# Patient Record
Sex: Male | Born: 1935 | Race: White | Hispanic: No | Marital: Married
Health system: Southern US, Community
[De-identification: ages and names within clinical notes are randomized; demographics above are authoritative.]

---

## 2007-03-26 ENCOUNTER — Ambulatory Visit: Payer: Self-pay

## 2008-11-20 ENCOUNTER — Inpatient Hospital Stay: Payer: Self-pay | Admitting: Internal Medicine

## 2011-09-27 ENCOUNTER — Inpatient Hospital Stay: Payer: Self-pay | Admitting: Internal Medicine

## 2011-09-27 LAB — BASIC METABOLIC PANEL
Anion Gap: 6 — ABNORMAL LOW (ref 7–16)
BUN: 11 mg/dL (ref 7–18)
Calcium, Total: 9 mg/dL (ref 8.5–10.1)
Chloride: 104 mmol/L (ref 98–107)
Co2: 28 mmol/L (ref 21–32)
EGFR (African American): >60
EGFR (Non-African Amer.): >60
Osmolality: 276 (ref 275–301)
Sodium: 138 mmol/L (ref 136–145)

## 2011-09-27 LAB — CBC
HCT: 41.5 % (ref 40.0–52.0)
HGB: 13.5 g/dL (ref 13.0–18.0)
MCH: 29.7 pg (ref 26.0–34.0)
MCHC: 32.5 g/dL (ref 32.0–36.0)
RBC: 4.55 10*6/uL (ref 4.40–5.90)
RDW: 16.3 % — ABNORMAL HIGH (ref 11.5–14.5)

## 2011-09-27 LAB — LIPID PANEL
Cholesterol: 145 mg/dL (ref 0–200)
HDL Cholesterol: 25 mg/dL — ABNORMAL LOW (ref 40–60)
Triglycerides: 131 mg/dL (ref 0–200)
VLDL Cholesterol, Calc: 26 mg/dL (ref 5–40)

## 2011-09-27 LAB — PRO B NATRIURETIC PEPTIDE: B-Type Natriuretic Peptide: 119 pg/mL (ref 0–450)

## 2011-09-27 LAB — TROPONIN I: Troponin-I: <0.02 ng/mL

## 2011-09-27 LAB — CK TOTAL AND CKMB (NOT AT ARMC): CK-MB: <0.5 ng/mL — ABNORMAL LOW (ref 0.5–3.6)

## 2011-09-28 LAB — BASIC METABOLIC PANEL
Osmolality: 272 (ref 275–301)
Potassium: 4.1 mmol/L (ref 3.5–5.1)
Sodium: 135 mmol/L — ABNORMAL LOW (ref 136–145)

## 2011-09-28 LAB — CK TOTAL AND CKMB (NOT AT ARMC)
CK, Total: 26 U/L — ABNORMAL LOW (ref 35–232)
CK-MB: 0.5 ng/mL (ref 0.5–3.6)

## 2011-09-29 LAB — BASIC METABOLIC PANEL
Chloride: 103 mmol/L (ref 98–107)
Co2: 24 mmol/L (ref 21–32)
Creatinine: 1.05 mg/dL (ref 0.60–1.30)
EGFR (Non-African Amer.): >60
Osmolality: 276 (ref 275–301)
Potassium: 4.2 mmol/L (ref 3.5–5.1)

## 2011-11-03 ENCOUNTER — Ambulatory Visit: Payer: Self-pay | Admitting: Internal Medicine

## 2012-02-24 ENCOUNTER — Emergency Department: Payer: Self-pay | Admitting: Emergency Medicine

## 2012-02-24 LAB — CBC
MCHC: 32.8 g/dL (ref 32.0–36.0)
MCV: 91 fL (ref 80–100)
RBC: 4.87 10*6/uL (ref 4.40–5.90)
RDW: 17.5 % — ABNORMAL HIGH (ref 11.5–14.5)

## 2012-02-24 LAB — COMPREHENSIVE METABOLIC PANEL
Albumin: 3.4 g/dL (ref 3.4–5.0)
Alkaline Phosphatase: 77 U/L (ref 50–136)
Anion Gap: 9 (ref 7–16)
BUN: 24 mg/dL — ABNORMAL HIGH (ref 7–18)
Calcium, Total: 9.4 mg/dL (ref 8.5–10.1)
Chloride: 97 mmol/L — ABNORMAL LOW (ref 98–107)
Co2: 25 mmol/L (ref 21–32)
Creatinine: 1.15 mg/dL (ref 0.60–1.30)
EGFR (African American): >60
Glucose: 129 mg/dL — ABNORMAL HIGH (ref 65–99)
Osmolality: 268 (ref 275–301)
Potassium: 4.5 mmol/L (ref 3.5–5.1)
SGOT(AST): 26 U/L (ref 15–37)
Sodium: 131 mmol/L — ABNORMAL LOW (ref 136–145)

## 2012-03-30 ENCOUNTER — Emergency Department: Payer: Self-pay | Admitting: Emergency Medicine

## 2012-03-30 LAB — CBC WITH DIFFERENTIAL/PLATELET
Basophil %: 0.2 %
Eosinophil #: 0.1 10*3/uL (ref 0.0–0.7)
Eosinophil %: 1.1 %
Lymphocyte #: 1.7 10*3/uL (ref 1.0–3.6)
MCH: 30.6 pg (ref 26.0–34.0)
MCHC: 32.7 g/dL (ref 32.0–36.0)
MCV: 94 fL (ref 80–100)
Monocyte #: 1 x10 3/mm (ref 0.2–1.0)
Neutrophil %: 74.8 %
Platelet: 298 10*3/uL (ref 150–440)
RBC: 4.22 10*6/uL — ABNORMAL LOW (ref 4.40–5.90)
WBC: 11.2 10*3/uL — ABNORMAL HIGH (ref 3.8–10.6)

## 2012-03-30 LAB — PROTIME-INR
INR: 0.8
Prothrombin Time: 11.3 secs — ABNORMAL LOW (ref 11.5–14.7)

## 2012-03-31 ENCOUNTER — Observation Stay: Payer: Self-pay | Admitting: Family Medicine

## 2012-03-31 LAB — CBC
HGB: 13 g/dL (ref 13.0–18.0)
MCH: 31 pg (ref 26.0–34.0)
MCHC: 33.3 g/dL (ref 32.0–36.0)
MCV: 93 fL (ref 80–100)
Platelet: 289 10*3/uL (ref 150–440)
RBC: 4.18 10*6/uL — ABNORMAL LOW (ref 4.40–5.90)
RDW: 20.5 % — ABNORMAL HIGH (ref 11.5–14.5)

## 2012-03-31 LAB — TROPONIN I: Troponin-I: 0.03 ng/mL

## 2012-03-31 LAB — CK TOTAL AND CKMB (NOT AT ARMC)
CK, Total: 19 U/L — ABNORMAL LOW (ref 35–232)
CK-MB: 1.5 ng/mL (ref 0.5–3.6)

## 2012-03-31 LAB — COMPREHENSIVE METABOLIC PANEL
Albumin: 3.1 g/dL — ABNORMAL LOW (ref 3.4–5.0)
Alkaline Phosphatase: 70 U/L (ref 50–136)
Anion Gap: 6 — ABNORMAL LOW (ref 7–16)
BUN: 21 mg/dL — ABNORMAL HIGH (ref 7–18)
Bilirubin,Total: 1 mg/dL (ref 0.2–1.0)
Chloride: 106 mmol/L (ref 98–107)
Glucose: 148 mg/dL — ABNORMAL HIGH (ref 65–99)
Potassium: 4.4 mmol/L (ref 3.5–5.1)
SGOT(AST): 21 U/L (ref 15–37)
SGPT (ALT): 32 U/L (ref 12–78)
Total Protein: 6.7 g/dL (ref 6.4–8.2)

## 2012-03-31 LAB — PRO B NATRIURETIC PEPTIDE: B-Type Natriuretic Peptide: 439 pg/mL (ref 0–450)

## 2012-04-05 ENCOUNTER — Inpatient Hospital Stay: Payer: Self-pay | Admitting: Family Medicine

## 2012-04-05 LAB — COMPREHENSIVE METABOLIC PANEL
Albumin: 2.6 g/dL — ABNORMAL LOW (ref 3.4–5.0)
Alkaline Phosphatase: 91 U/L (ref 50–136)
Anion Gap: 8 (ref 7–16)
BUN: 20 mg/dL — ABNORMAL HIGH (ref 7–18)
Bilirubin,Total: 0.4 mg/dL (ref 0.2–1.0)
Chloride: 100 mmol/L (ref 98–107)
Creatinine: 0.84 mg/dL (ref 0.60–1.30)
Glucose: 161 mg/dL — ABNORMAL HIGH (ref 65–99)
Potassium: 4.7 mmol/L (ref 3.5–5.1)
SGOT(AST): 20 U/L (ref 15–37)
SGPT (ALT): 29 U/L (ref 12–78)
Total Protein: 6.6 g/dL (ref 6.4–8.2)

## 2012-04-05 LAB — TROPONIN I: Troponin-I: 0.43 ng/mL — ABNORMAL HIGH

## 2012-04-05 LAB — PRO B NATRIURETIC PEPTIDE: B-Type Natriuretic Peptide: 1819 pg/mL — ABNORMAL HIGH (ref 0–450)

## 2012-04-05 LAB — CBC
MCH: 30.8 pg (ref 26.0–34.0)
Platelet: 284 10*3/uL (ref 150–440)
RBC: 3.41 10*6/uL — ABNORMAL LOW (ref 4.40–5.90)
RDW: 19.5 % — ABNORMAL HIGH (ref 11.5–14.5)
WBC: 14.2 10*3/uL — ABNORMAL HIGH (ref 3.8–10.6)

## 2012-04-06 LAB — TROPONIN I
Troponin-I: 0.79 ng/mL — ABNORMAL HIGH
Troponin-I: 0.6 ng/mL — ABNORMAL HIGH

## 2012-04-06 LAB — BASIC METABOLIC PANEL
Anion Gap: 9 (ref 7–16)
Calcium, Total: 9.3 mg/dL (ref 8.5–10.1)
Chloride: 97 mmol/L — ABNORMAL LOW (ref 98–107)
Co2: 30 mmol/L (ref 21–32)
EGFR (Non-African Amer.): >60
Glucose: 152 mg/dL — ABNORMAL HIGH (ref 65–99)
Osmolality: 278 (ref 275–301)
Potassium: 4 mmol/L (ref 3.5–5.1)

## 2012-04-06 LAB — CULTURE, BLOOD (SINGLE)

## 2012-04-07 LAB — CBC WITH DIFFERENTIAL/PLATELET
Basophil #: 0 10*3/uL (ref 0.0–0.1)
Eosinophil #: 0.1 10*3/uL (ref 0.0–0.7)
HCT: 28.6 % — ABNORMAL LOW (ref 40.0–52.0)
Lymphocyte %: 6.6 %
MCH: 31.4 pg (ref 26.0–34.0)
MCHC: 33.9 g/dL (ref 32.0–36.0)
Monocyte #: 0.6 x10 3/mm (ref 0.2–1.0)
Monocyte %: 4 %
Neutrophil #: 12.3 10*3/uL — ABNORMAL HIGH (ref 1.4–6.5)
Neutrophil %: 88.1 %
Platelet: 330 10*3/uL (ref 150–440)
RDW: 18.9 % — ABNORMAL HIGH (ref 11.5–14.5)
WBC: 13.9 10*3/uL — ABNORMAL HIGH (ref 3.8–10.6)

## 2012-04-07 LAB — BASIC METABOLIC PANEL
Anion Gap: 7 (ref 7–16)
BUN: 35 mg/dL — ABNORMAL HIGH (ref 7–18)
Co2: 32 mmol/L (ref 21–32)
EGFR (African American): >60
EGFR (Non-African Amer.): >60
Glucose: 146 mg/dL — ABNORMAL HIGH (ref 65–99)
Osmolality: 273 (ref 275–301)
Sodium: 131 mmol/L — ABNORMAL LOW (ref 136–145)

## 2012-04-08 LAB — CBC WITH DIFFERENTIAL/PLATELET
Basophil #: 0 10*3/uL (ref 0.0–0.1)
Basophil %: 0.1 %
Eosinophil #: 0 10*3/uL (ref 0.0–0.7)
HGB: 9.2 g/dL — ABNORMAL LOW (ref 13.0–18.0)
Lymphocyte %: 5.9 %
MCHC: 32.5 g/dL (ref 32.0–36.0)
MCV: 93 fL (ref 80–100)
Monocyte %: 2.1 %
Neutrophil %: 91.8 %
RBC: 3.06 10*6/uL — ABNORMAL LOW (ref 4.40–5.90)
RDW: 19 % — ABNORMAL HIGH (ref 11.5–14.5)
WBC: 9.4 10*3/uL (ref 3.8–10.6)

## 2012-04-08 LAB — BASIC METABOLIC PANEL
Anion Gap: 8 (ref 7–16)
BUN: 43 mg/dL — ABNORMAL HIGH (ref 7–18)
Calcium, Total: 9.1 mg/dL (ref 8.5–10.1)
Chloride: 91 mmol/L — ABNORMAL LOW (ref 98–107)
Co2: 32 mmol/L (ref 21–32)
Creatinine: 1.04 mg/dL (ref 0.60–1.30)
Osmolality: 279 (ref 275–301)
Potassium: 4.1 mmol/L (ref 3.5–5.1)

## 2012-04-09 LAB — BASIC METABOLIC PANEL
Calcium, Total: 9.3 mg/dL (ref 8.5–10.1)
Chloride: 90 mmol/L — ABNORMAL LOW (ref 98–107)
EGFR (African American): >60
EGFR (Non-African Amer.): 58 — ABNORMAL LOW
Glucose: 225 mg/dL — ABNORMAL HIGH (ref 65–99)
Osmolality: 281 (ref 275–301)
Potassium: 4.4 mmol/L (ref 3.5–5.1)
Sodium: 129 mmol/L — ABNORMAL LOW (ref 136–145)

## 2012-04-09 LAB — CBC WITH DIFFERENTIAL/PLATELET
Basophil #: 0 10*3/uL (ref 0.0–0.1)
Basophil %: 0.3 %
Eosinophil %: 0 %
HGB: 9.2 g/dL — ABNORMAL LOW (ref 13.0–18.0)
Lymphocyte %: 4.5 %
MCH: 30.8 pg (ref 26.0–34.0)
MCV: 93 fL (ref 80–100)
Monocyte #: 0.5 x10 3/mm (ref 0.2–1.0)
Neutrophil %: 91 %
Platelet: 389 10*3/uL (ref 150–440)
RBC: 2.99 10*6/uL — ABNORMAL LOW (ref 4.40–5.90)
WBC: 11.8 10*3/uL — ABNORMAL HIGH (ref 3.8–10.6)

## 2012-04-10 LAB — CBC WITH DIFFERENTIAL/PLATELET
Bands: 1 %
HCT: 22.5 % — ABNORMAL LOW (ref 40.0–52.0)
HGB: 7.6 g/dL — ABNORMAL LOW (ref 13.0–18.0)
MCHC: 33.8 g/dL (ref 32.0–36.0)
Metamyelocyte: 1 %
Monocytes: 3 %
Myelocyte: 2 %
Platelet: 362 10*3/uL (ref 150–440)
RBC: 2.43 10*6/uL — ABNORMAL LOW (ref 4.40–5.90)
Segmented Neutrophils: 85 %
WBC: 12.5 10*3/uL — ABNORMAL HIGH (ref 3.8–10.6)

## 2012-04-10 LAB — BASIC METABOLIC PANEL
BUN: 60 mg/dL — ABNORMAL HIGH (ref 7–18)
Chloride: 93 mmol/L — ABNORMAL LOW (ref 98–107)
Co2: 29 mmol/L (ref 21–32)
Creatinine: 1.09 mg/dL (ref 0.60–1.30)
EGFR (African American): >60
EGFR (Non-African Amer.): >60
Osmolality: 285 (ref 275–301)
Potassium: 4.8 mmol/L (ref 3.5–5.1)
Sodium: 130 mmol/L — ABNORMAL LOW (ref 136–145)

## 2012-04-10 LAB — SEDIMENTATION RATE: Erythrocyte Sed Rate: 81 mm/hr — ABNORMAL HIGH (ref 0–20)

## 2012-04-11 ENCOUNTER — Ambulatory Visit: Payer: Self-pay | Admitting: Internal Medicine

## 2012-04-11 LAB — CBC WITH DIFFERENTIAL/PLATELET
Lymphocytes: 7 %
MCHC: 32.3 g/dL (ref 32.0–36.0)
MCV: 93 fL (ref 80–100)
Myelocyte: 4 %
NRBC/100 WBC: 1 /
RBC: 2.5 10*6/uL — ABNORMAL LOW (ref 4.40–5.90)
RDW: 19.2 % — ABNORMAL HIGH (ref 11.5–14.5)
WBC: 16.8 10*3/uL — ABNORMAL HIGH (ref 3.8–10.6)

## 2012-04-11 LAB — BASIC METABOLIC PANEL
Anion Gap: 7 (ref 7–16)
BUN: 49 mg/dL — ABNORMAL HIGH (ref 7–18)
Calcium, Total: 8.8 mg/dL (ref 8.5–10.1)
Chloride: 94 mmol/L — ABNORMAL LOW (ref 98–107)
EGFR (African American): >60
EGFR (Non-African Amer.): >60
Glucose: 171 mg/dL — ABNORMAL HIGH (ref 65–99)
Sodium: 130 mmol/L — ABNORMAL LOW (ref 136–145)

## 2012-04-11 LAB — HEPATIC FUNCTION PANEL A (ARMC)
Albumin: 2.5 g/dL — ABNORMAL LOW (ref 3.4–5.0)
Alkaline Phosphatase: 68 U/L (ref 50–136)
Bilirubin,Total: 0.3 mg/dL (ref 0.2–1.0)
SGOT(AST): 19 U/L (ref 15–37)
SGPT (ALT): 28 U/L (ref 12–78)
Total Protein: 6.1 g/dL — ABNORMAL LOW (ref 6.4–8.2)

## 2012-04-11 LAB — IRON AND TIBC
Iron Bind.Cap.(Total): 401 ug/dL (ref 250–450)
Iron Saturation: 19 %
Iron: 76 ug/dL (ref 65–175)

## 2012-04-11 LAB — RETICULOCYTES
Absolute Retic Count: 0.157 10*6/uL — ABNORMAL HIGH (ref 0.031–0.129)
Reticulocyte: 6.3 % — ABNORMAL HIGH (ref 0.7–2.5)

## 2012-04-11 LAB — LACTATE DEHYDROGENASE: LDH: 268 U/L — ABNORMAL HIGH (ref 85–241)

## 2012-04-11 LAB — FERRITIN: Ferritin (ARMC): 380 ng/mL (ref 8–388)

## 2012-04-12 LAB — CBC WITH DIFFERENTIAL/PLATELET
HCT: 28 % — ABNORMAL LOW (ref 40.0–52.0)
HGB: 9.2 g/dL — ABNORMAL LOW (ref 13.0–18.0)
MCH: 30.5 pg (ref 26.0–34.0)
MCV: 93 fL (ref 80–100)
Metamyelocyte: 6 %
Platelet: 391 10*3/uL (ref 150–440)
RBC: 3.03 10*6/uL — ABNORMAL LOW (ref 4.40–5.90)
Segmented Neutrophils: 74 %
Variant Lymphocyte - H1-Rlymph: 2 %
WBC: 25.8 10*3/uL — ABNORMAL HIGH (ref 3.8–10.6)

## 2012-04-12 LAB — BASIC METABOLIC PANEL
Anion Gap: 8 (ref 7–16)
BUN: 41 mg/dL — ABNORMAL HIGH (ref 7–18)
Co2: 26 mmol/L (ref 21–32)
Creatinine: 1.01 mg/dL (ref 0.60–1.30)
EGFR (African American): >60
EGFR (Non-African Amer.): >60
Glucose: 208 mg/dL — ABNORMAL HIGH (ref 65–99)
Osmolality: 275 (ref 275–301)
Potassium: 5.2 mmol/L — ABNORMAL HIGH (ref 3.5–5.1)
Sodium: 129 mmol/L — ABNORMAL LOW (ref 136–145)

## 2012-04-12 LAB — SODIUM, URINE, RANDOM: Sodium, Urine Random: 30 mmol/L (ref 20–110)

## 2012-04-12 LAB — CREATININE, URINE, RANDOM: Creatinine, Urine Random: 58.7 mg/dL (ref 30.0–125.0)

## 2012-04-13 LAB — BASIC METABOLIC PANEL
Calcium, Total: 8.8 mg/dL (ref 8.5–10.1)
Chloride: 96 mmol/L — ABNORMAL LOW (ref 98–107)
EGFR (African American): >60
Potassium: 4.9 mmol/L (ref 3.5–5.1)
Sodium: 132 mmol/L — ABNORMAL LOW (ref 136–145)

## 2012-04-13 LAB — CBC WITH DIFFERENTIAL/PLATELET
HCT: 27.1 % — ABNORMAL LOW (ref 40.0–52.0)
MCH: 30.2 pg (ref 26.0–34.0)
MCHC: 32.6 g/dL (ref 32.0–36.0)
MCV: 93 fL (ref 80–100)
Metamyelocyte: 1 %
Monocytes: 5 %
Myelocyte: 2 %
NRBC/100 WBC: 1 /
Platelet: 427 10*3/uL (ref 150–440)
RBC: 2.92 10*6/uL — ABNORMAL LOW (ref 4.40–5.90)
RDW: 18.6 % — ABNORMAL HIGH (ref 11.5–14.5)
Segmented Neutrophils: 80 %
Variant Lymphocyte - H1-Rlymph: 2 %

## 2012-04-13 LAB — UR PROT ELECTROPHORESIS, URINE RANDOM

## 2012-04-14 LAB — CBC WITH DIFFERENTIAL/PLATELET
Bands: 3 %
HCT: 27.8 % — ABNORMAL LOW (ref 40.0–52.0)
Lymphocytes: 7 %
MCH: 30.3 pg (ref 26.0–34.0)
MCHC: 32.2 g/dL (ref 32.0–36.0)
MCV: 94 fL (ref 80–100)
Metamyelocyte: 2 %
NRBC/100 WBC: 2 /
Platelet: 426 10*3/uL (ref 150–440)
RDW: 18.8 % — ABNORMAL HIGH (ref 11.5–14.5)
Segmented Neutrophils: 85 %
WBC: 20.9 10*3/uL — ABNORMAL HIGH (ref 3.8–10.6)

## 2012-04-14 LAB — BASIC METABOLIC PANEL
Anion Gap: 6 — ABNORMAL LOW (ref 7–16)
BUN: 34 mg/dL — ABNORMAL HIGH (ref 7–18)
Chloride: 98 mmol/L (ref 98–107)
Co2: 29 mmol/L (ref 21–32)
Glucose: 207 mg/dL — ABNORMAL HIGH (ref 65–99)
Osmolality: 280 (ref 275–301)
Potassium: 4.7 mmol/L (ref 3.5–5.1)
Sodium: 133 mmol/L — ABNORMAL LOW (ref 136–145)

## 2012-04-16 LAB — PROTEIN ELECTROPHORESIS(ARMC)

## 2012-05-12 ENCOUNTER — Ambulatory Visit: Payer: Self-pay | Admitting: Internal Medicine

## 2012-05-12 DEATH — deceased

## 2013-05-12 IMAGING — CR DG CHEST 2V
1 series · 2 of 2 positions shown · non-contrast
Comparison: none

REASON FOR EXAM: short of breath, tachycardia
COMMENTS:

PROCEDURE:     DXR - DXR CHEST PA (OR AP) AND LATERAL  - March 31, 2012  [DATE]
RESULT:     Comparison: 09/27/2011

[Series 1: w chest pa · 0.14mm/px · 2 of 2 slices shown]
[im 1/2]
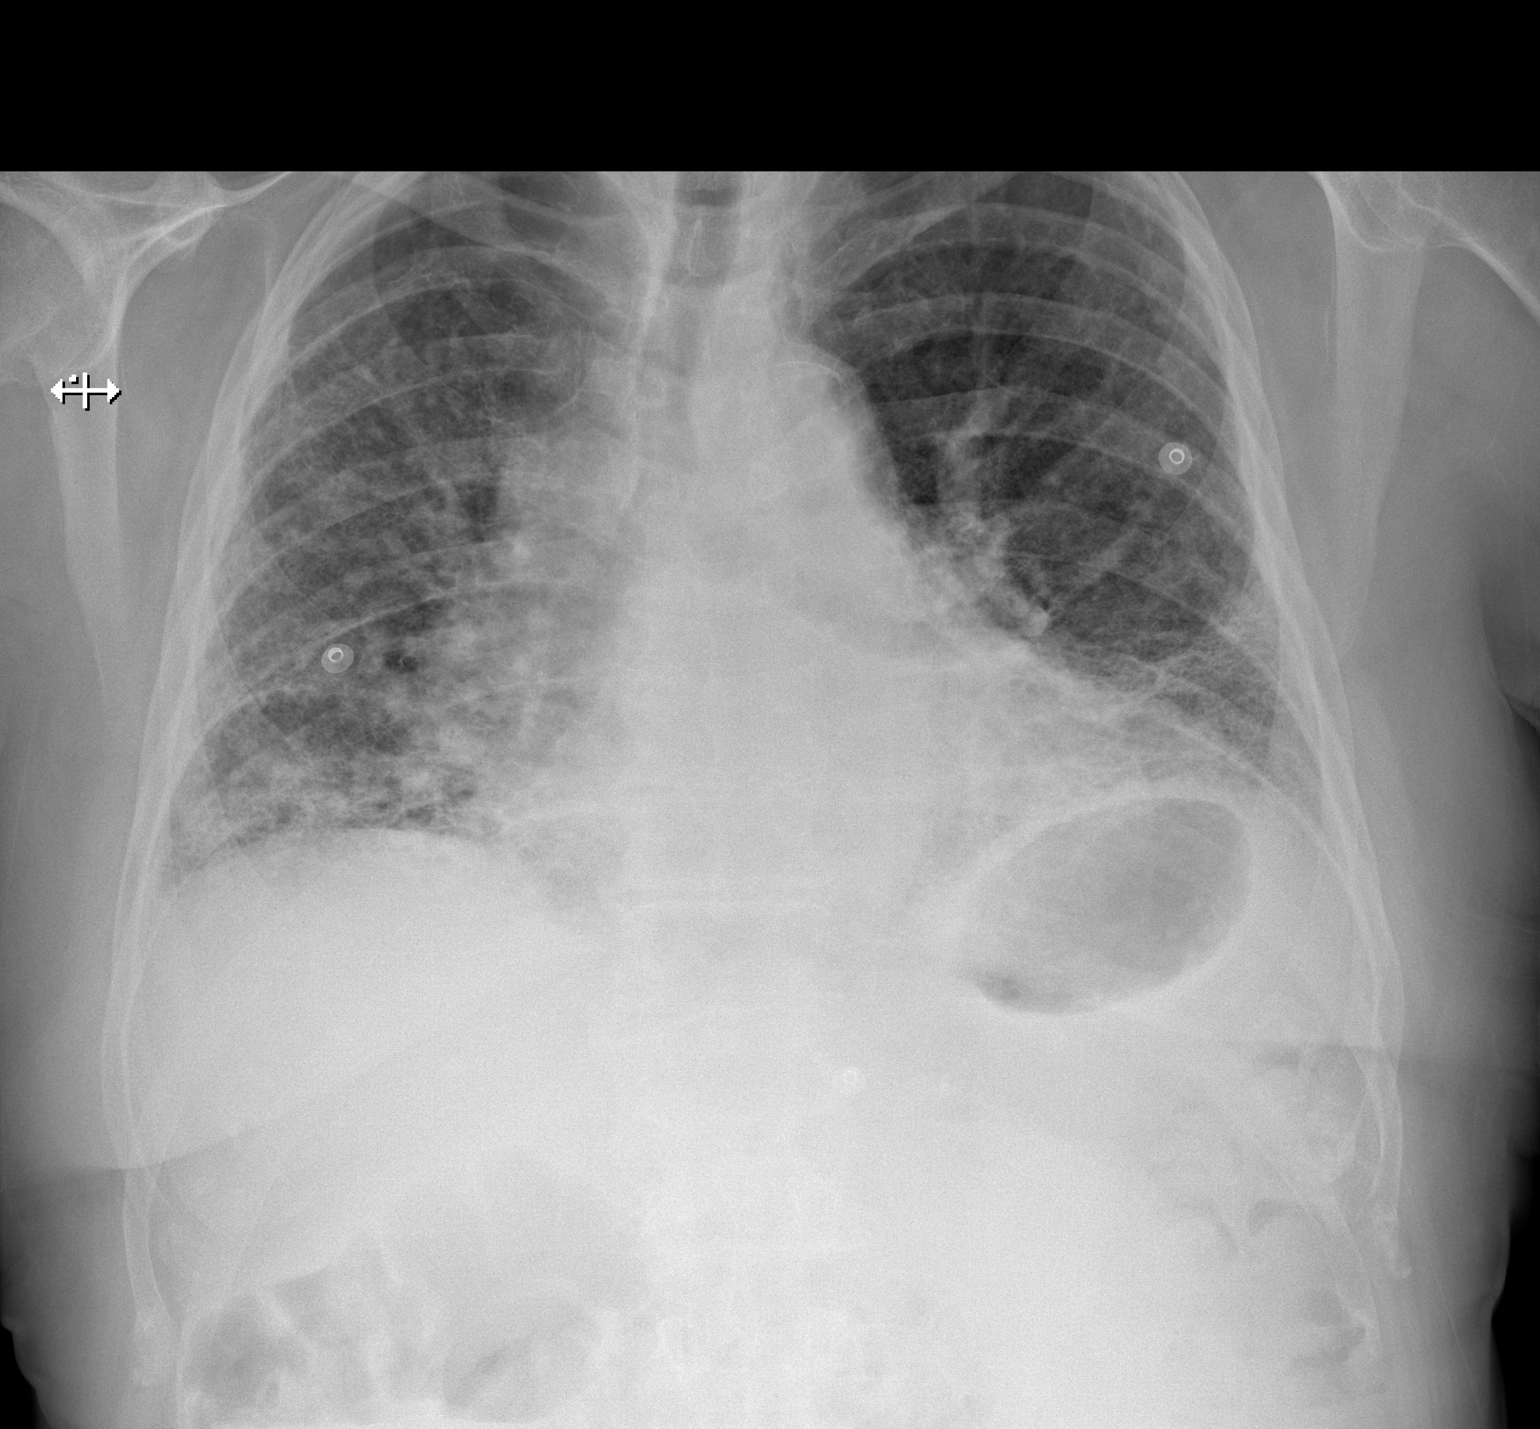
[im 2/2]
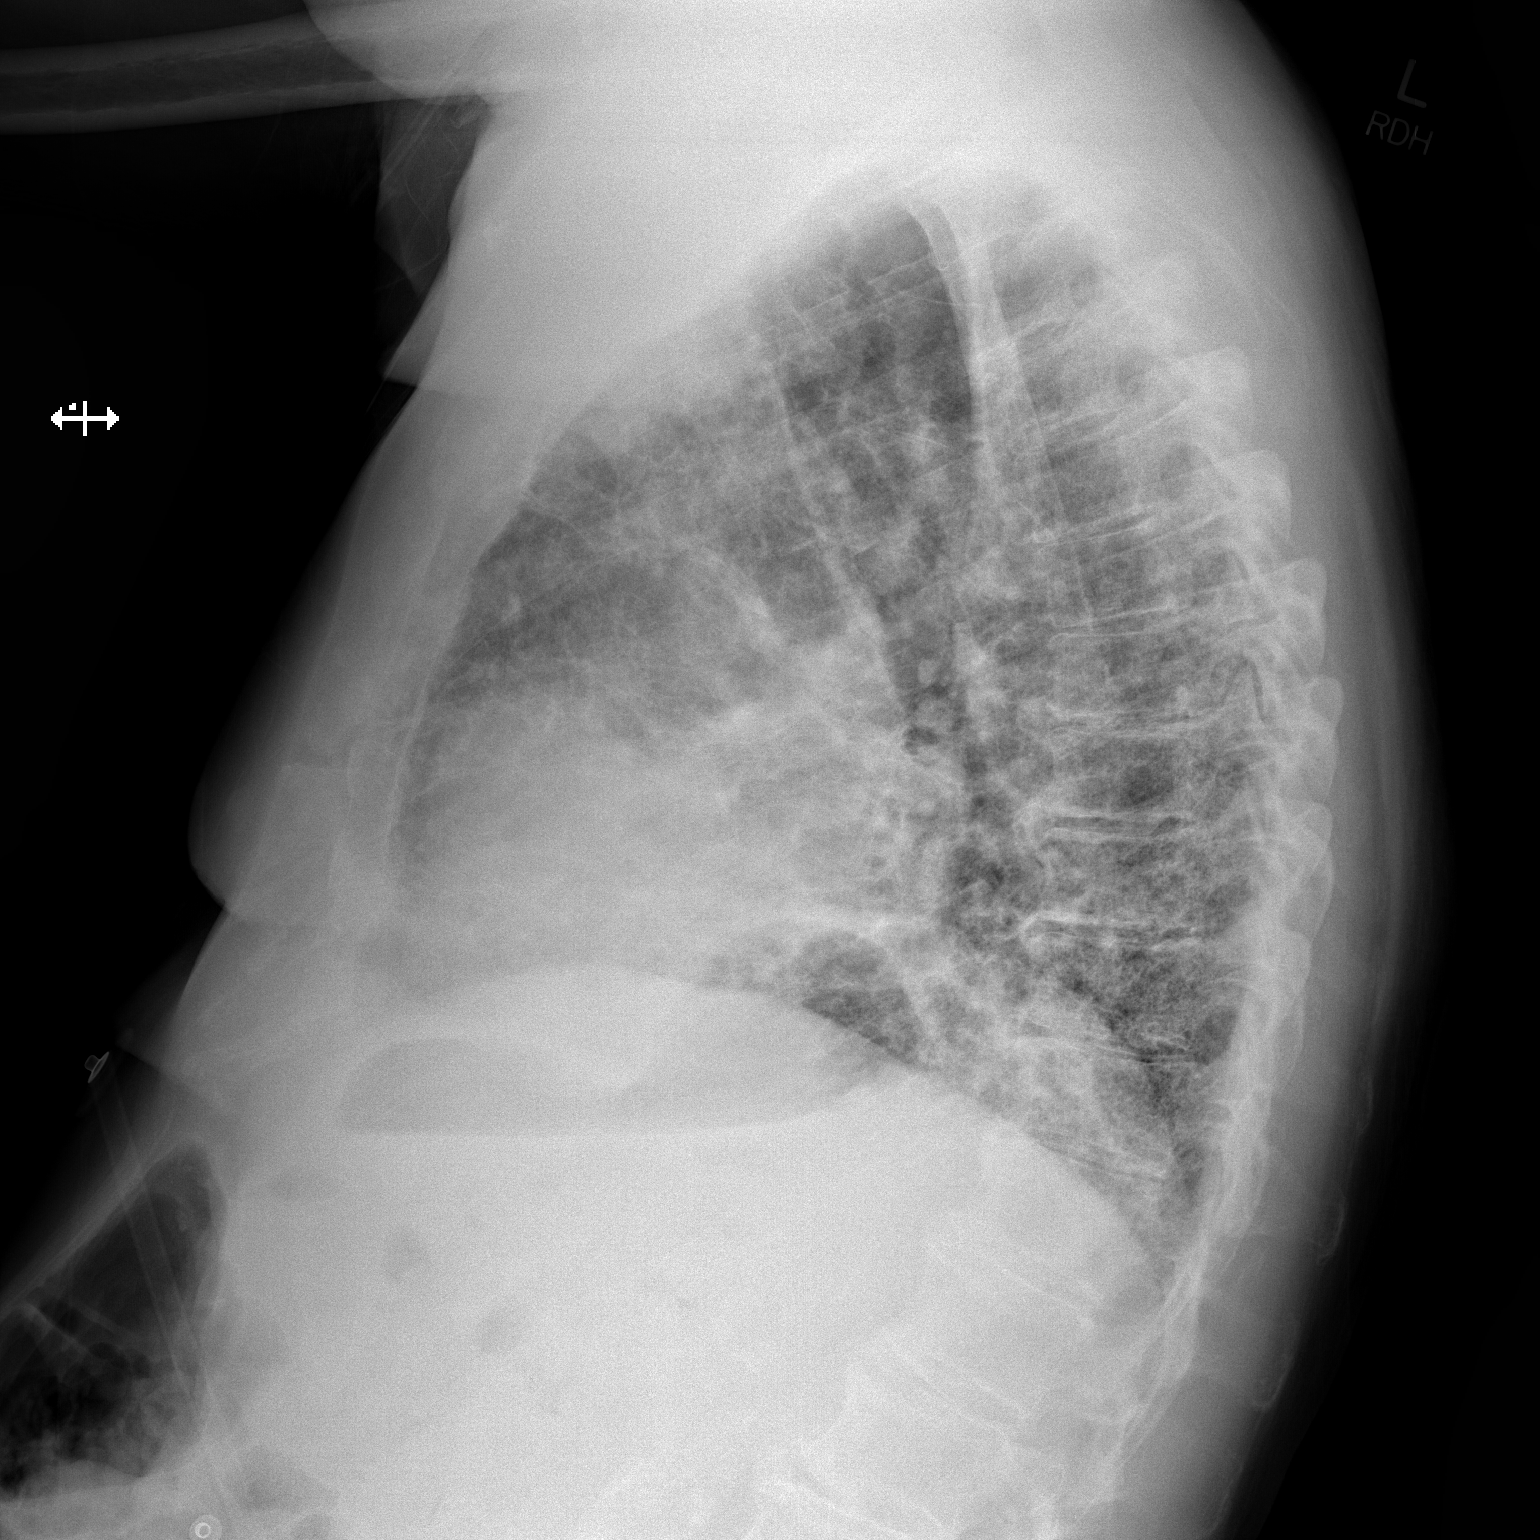

[2 of 2 positions shown; findings below may reference images not displayed]

FINDINGS: PA and lateral chest radiographs are provided. There is bilateral diffuse
interstitial thickening likely representing chronic interstitial disease,
but superimposed mild interstitial edema versus interstitial pneumonitis
secondary to an infectious or inflammatory etiology cannot be excluded.
There is no focal parenchymal opacity, pleural effusion, or pneumothorax.
The heart and mediastinum are unremarkable.  The osseous structures are
unremarkable.
IMPRESSION: Bilateral diffuse interstitial thickening likely representing chronic
interstitial disease, but superimposed mild interstitial edema versus
interstitial pneumonitis secondary to an infectious or inflammatory etiology
cannot be excluded.

[REDACTED]

## 2013-05-20 IMAGING — CT CT CHEST W/ CM
2 series · 15 of 31 positions shown, 19 images · IV contrast (APPLIED)
Comparison: none

REASON FOR EXAM: hypoxia
COMMENTS:

PROCEDURE:     CT  - CT CHEST (FOR PE) W  - April 08, 2012 [DATE]
RESULT:     Chest CT dated 04/08/2012 comparison made to prior study dated
09/27/2011.
TECHNIQUE: Helical 3 mm sections are obtained the thoracic inlet through the
lung bases status post intravenous administration of 100 mm of [id number].

[Series 4: soft tissue · axial · 0.74mm/px · z∈[+125,+173]mm · 2 of 103 slices shown]
[im 8/103  mediastinal]
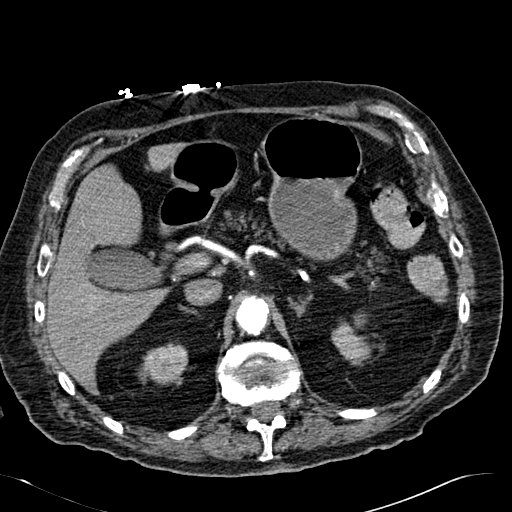
[im 24/103  mediastinal]
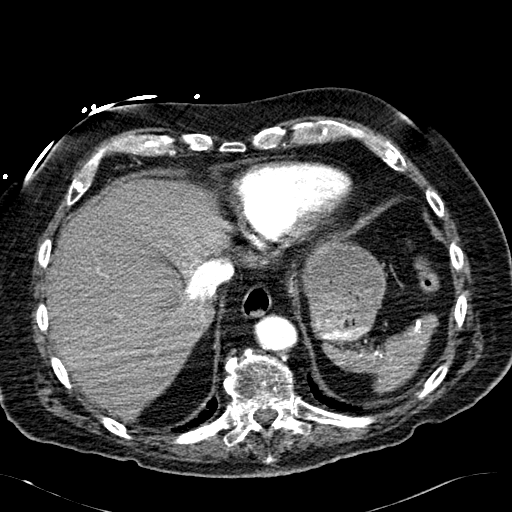

[Series 5: lung windows · axial · 0.74mm/px · z∈[+131,+386]mm · 13 of 101 slices shown, 17 images]
[im 8/101  mediastinal]
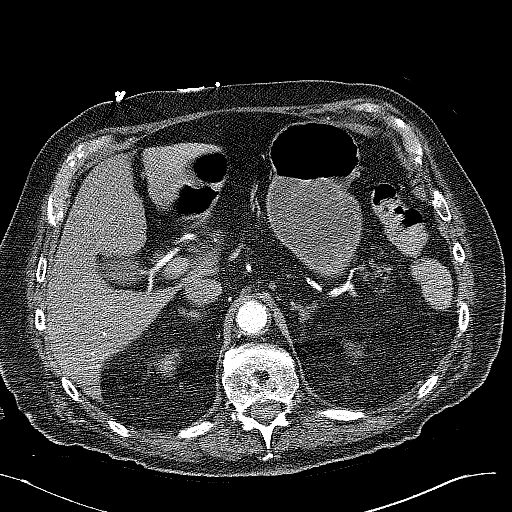
[im 8/101  lung]
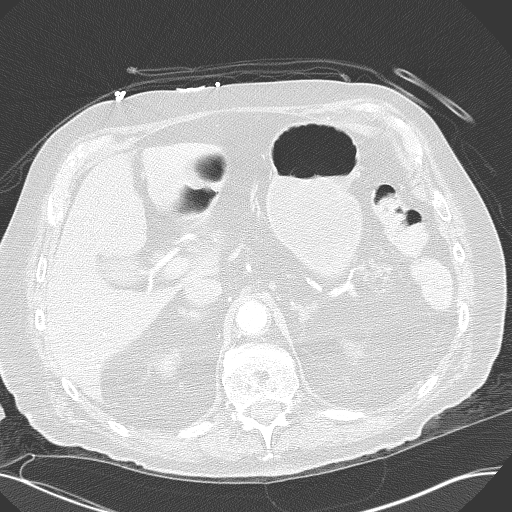
[im 16/101  lung]
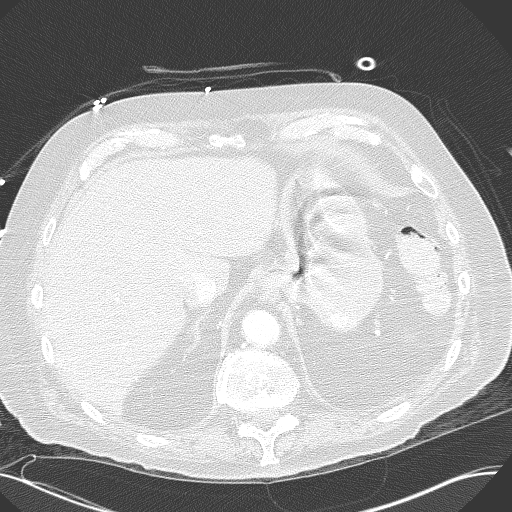
[im 24/101  lung]
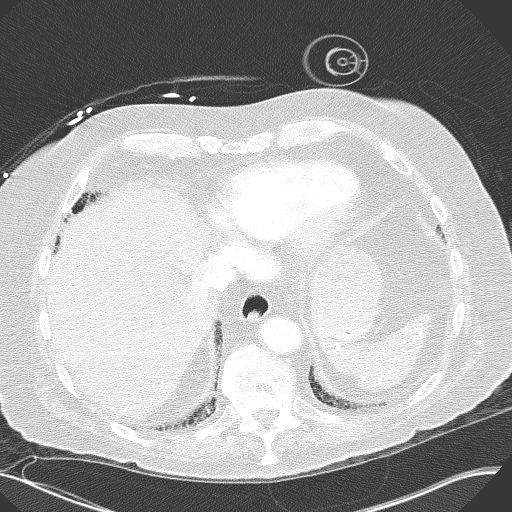
[im 31/101  lung]
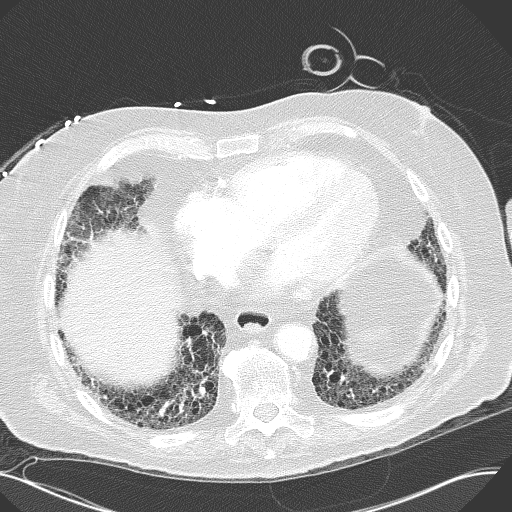
[im 39/101  mediastinal]
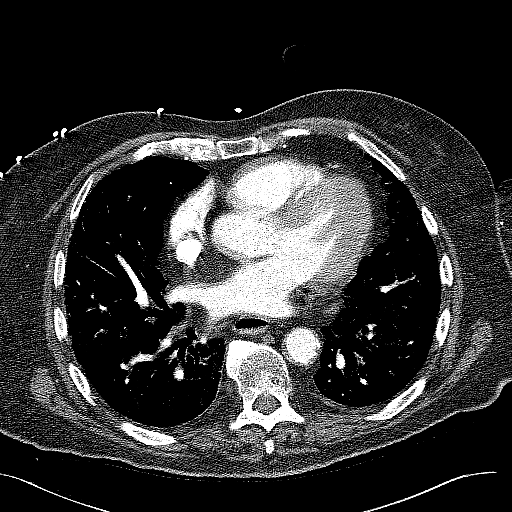
[im 39/101  lung]
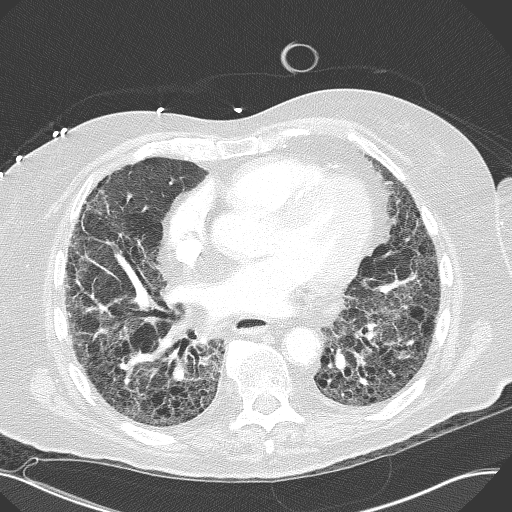
[im 47/101  lung]
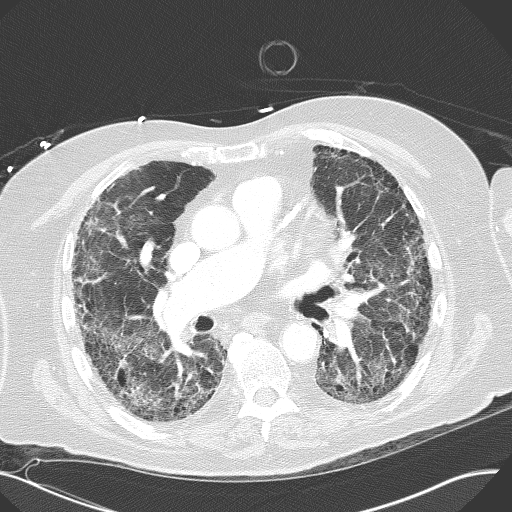
[im 51/101  lung]
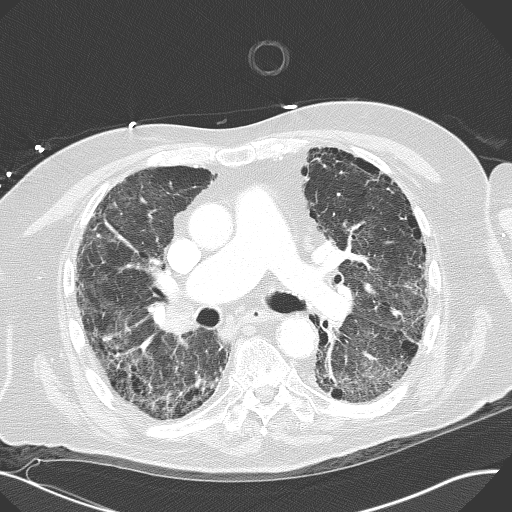
[im 54/101  lung]
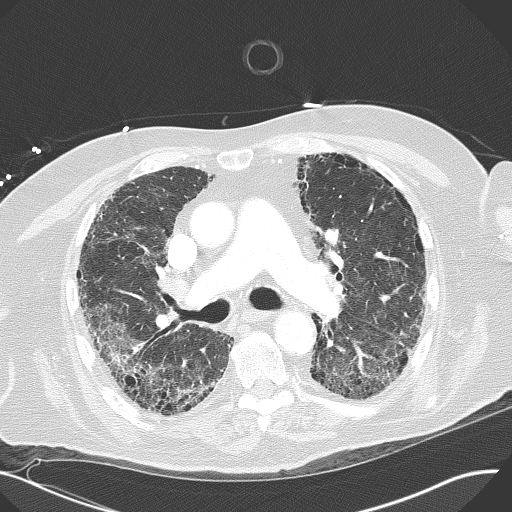
[im 62/101  mediastinal]
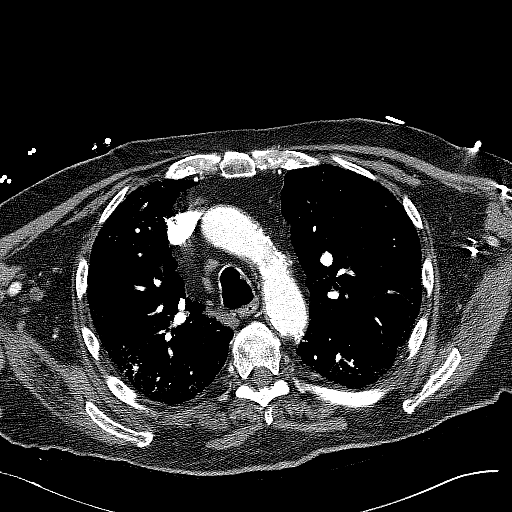
[im 62/101  lung]
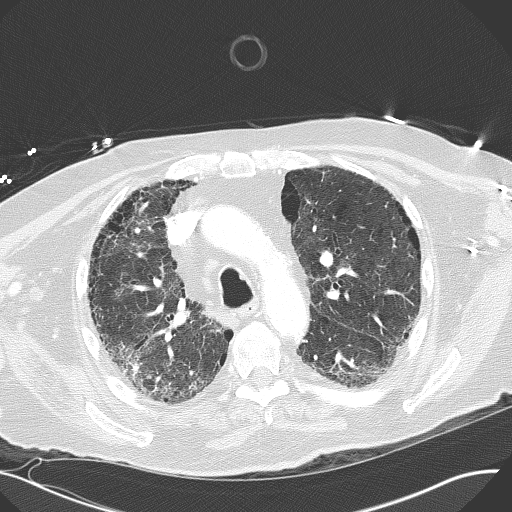
[im 70/101  lung]
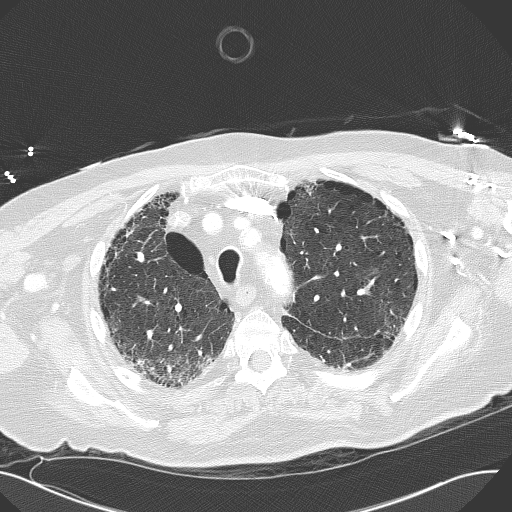
[im 77/101  lung]
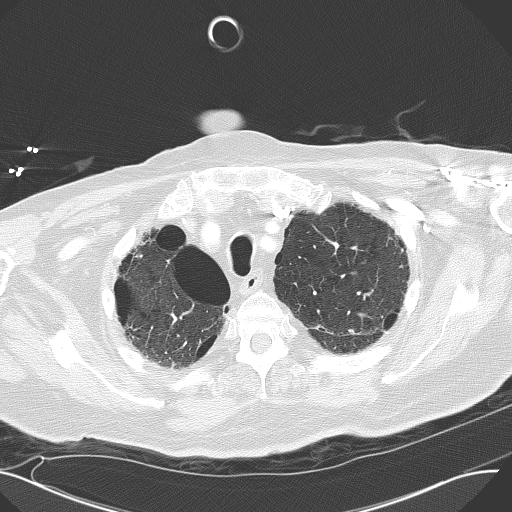
[im 85/101  lung]
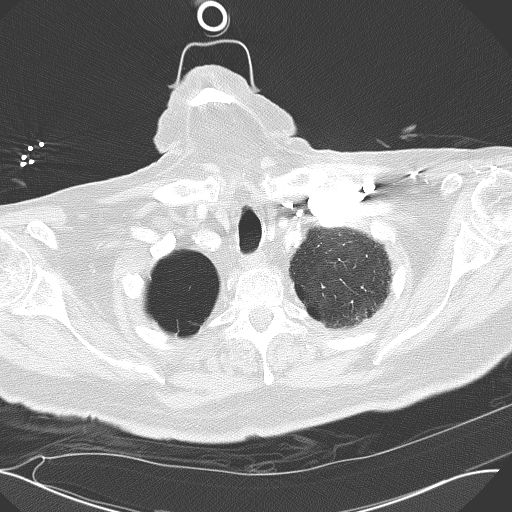
[im 93/101  mediastinal]
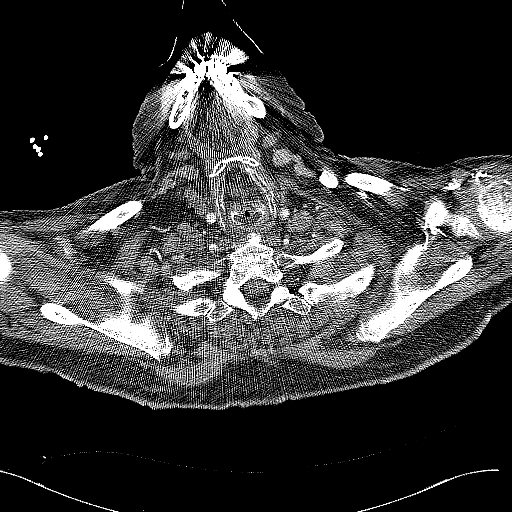
[im 93/101  lung]
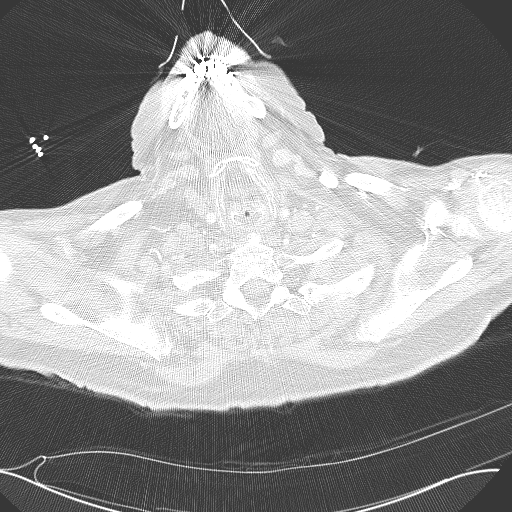

[15 of 31 positions shown; findings below may reference images not displayed]

FINDINGS: The mediastinum and hilar regions and structures demonstrate
multichamber cardiac enlargement. Prominent, slightly, lymph nodes
identified within the prevascular space, peritracheal region, right and left
hilar regions, and the subcarinal region.

There is no evidence of filling defects within the main, lobar, or segmental
pulmonary arteries.

The lung parenchyma demonstrates severe if symptoms changes within the lung
apices on the left. Areas of peripheral bullous disease identified within
the right left hemithoraces. There is diffuse groundglass opacities
throughout both lungs as well as prominence of interstitial markings, bi-
basilar honeycombing, areas of bronchiectasis, intralobular and subpleural
septal thickening.

Visualized upper abdominal viscera demonstrates multiple calcified densities
within the liver and spleen as well as likely a cyst within the left kidney.
IMPRESSION: No CT evidence of pulmonary embolic disease.
2. End-stage interstitial fibrotic changes as well as emphysematous changes.
3. Likely reactive lymph nodes in the mediastinum
4. Findings consistent with prior granulomatous disease.

## 2014-07-29 NOTE — H&P (Signed)
PATIENT NAME:  Suszanne FinchQUEEN, Sheehan R MR#:  [id number] DATE OF BIRTH:  April 12, 1928  DATE OF ADMISSION:  03/31/2012  PRIMARY CARE PHYSICIAN: Dr. Burnadette PopLinthavong.   CHIEF COMPLAINT: Shortness of breath.   HISTORY OF PRESENT ILLNESS: This is an 81 year old male patient with a history of COPD, oxygen independent, and a history diastolic heart failure, coronary artery disease, came in because the patient was feeling shortness of breath. The patient had epistaxis and nose was packed yesterday. The patient is supposed to use oxygen 4 L, but was unable to use it because nose was packed, so he came in here, and patient found to have heart rate of up to 136, so I was asked to admit the patient for sinus tachycardia and COPD exacerbation. The patient says he has trouble breathing on exertion, but this is not new, and denies any cough and no recent change in his breathing symptoms. The patient has a history of diastolic heart failure, history of COPD. The patient has been getting Lyrica and Neurontin for postherpetic neuralgia. He had shingles 6 days ago and finished Valtrex and right now, he is on Lyrica and Neurontin that Dr. Burnadette PopLinthavong prescribed. His nose pack was removed in the Emergency Room from the left nose. The patient right now is not bleeding from the nose, and he is on aspirin and Plavix for his stent. He did not take any Plavix today. The patient was given Augmentin yesterday after the nose was packed.   ALLERGIES: He has no allergies.   MEDICATIONS: Augmentin 500/125 mg p.o. b.i.d., aspirin 81 mg daily, Plavix 75 mg daily, fish oil 1.2 g daily, furosemide 20 mg as needed, gabapentin 300 mg at bedtime, Imdur 30 mg p.o. daily, lovastatin 20 mg daily, Lyrica 75 mg p.o. b.i.d., metoprolol succinate ER 25 mg p.o. daily, Osteo Bi-Flex 1 tablet daily, Symbicort 80/4.5 two puffs b.i.d., tamsulosin 0.4 mg daily.   FAMILY HISTORY: Father died of  MI.  SOCIAL HISTORY: He lives with a son. Ex-smoker, smoked for 60 years.    REVIEW OF SYSTEMS:   CONSTITUTIONAL: He feels fatigue and pain over the right chest wall, pain due to postherpetic neuralgia. EYES: No blurred vision.  ENT: No tinnitus. No ear pain. No epistaxis. He had epistaxis yesterday and nose was packed, and I can see dried blood in the left nostril.  RESPIRATORY: Has no wheezing. Has history of COPD, oxygen dependent.  CARDIOVASCULAR: Feels somewhat tightness in the chest on and off, which is not new according to him.  GASTROINTESTINAL: No nausea, no vomiting.  GENITOURINARY: No dysuria.  ENDOCRINE: No polyuria or nocturia.  INTEGUMENTARY: No skin rashes.  MUSCULOSKELETAL: Has no joint pains.  NEUROLOGIC: No weakness. The patient has no tremors.  PSYCHIATRIC: No anxiety or insomnia.   PHYSICAL EXAMINATION:  VITAL SIGNS: Temperature 97, pulse 136 initially. During my visit, it was around 115. Respirations 18 to 20. Blood pressure 104/51. Sats 92%. He is right now on 4 L, saturating around 95%.  GENERAL: He is alert, awake, oriented.  HEENT: Head atraumatic, normocephalic. Pupils equally reacting to light. Extraocular movements are intact. ENT: No tympanic membrane congestion. No turbinate hypertrophy. No oropharyngeal erythema.  NECK: Normal range of motion. No JVD. No carotid bruit.  CARDIOVASCULAR: S1, S2, regular, tachycardic.  LUNGS: Decreased breath sounds overall, but no wheezing, no rales.  ABDOMEN: Soft, obese. Bowel sounds present. I do not see any shingles rash on the right side of abdominal wall.  EXTREMITIES: The patient has 1+ edema. No cyanosis.  No clubbing.  NEUROLOGIC: Alert, awake, oriented. Cranial nerves II through XII intact. Power 5/5 in upper and lower extremities. Sensation is intact. DTRs 2+ bilaterally.   LABORATORY DATA: Chest x-ray shows interstitial thickening, representing chronic interstitial disease, but superimposed interstitial edema or interstitial pneumonia secondary to infectious or inflammatory etiology cannot  be excluded. WBC 11.1, hemoglobin 13, hematocrit 39  Electrolytes: Sodium 139, potassium 4.4, chloride 106, bicarbonate 27, BUN 21, creatinine 0.90, glucose 148. BNP is 439. EKG shows sinus tachycardia with 119 beats per minute with no ST-T changes.   ASSESSMENT AND PLAN:  The patient is an 81 year old male patient with shortness of breath, is chronic. His shortness of breath is likely secondary to not able to use the oxygen at night due to history of epistaxis yesterday. Right now, he is saturating around 98% on 4 L, and he has severe lung disease via PET scan during the last visit. The patient can use his medications, which are his nebulizers as needed.   The patient has elevated heart rate, sinus tachycardia, probably secondary to pain, but he has some postherpetic neuralgia. He did receive Dilaudid in the Emergency Room. He got Dilaudid 0.5 IV, two doses. The patient was started on Lyrica and Neurontin by his doctor, Dr. Burnadette Pop,  so he can continue that and get p.r.n. Dilaudid if needed. Also, we are going to increase Toprol-XL to 50 mg daily.   Coronary artery disease with recent stent placement. The patient is on aspirin and Plavix, but Plavix was stopped yesterday and today due to epistaxis, but his epistaxis has stopped. We are going to start him in the morning and watch him. If he restarts to bleed, we can call Dr. Gwen Pounds.   Continue to finish the Augmentin.   Condition at this time is stable. The patient actually looks like superimposed edema versus pneumonia due to infectious or inflammatory etiologies cannot be excluded daily, but I really do not think he has any acute process going on at this time and we will watch him overnight on telemetry. Discussed the plan with patient's son.   TIME SPENT: About 60 minutes on this patient.   ____________________________ Katha Hamming, MD sk:jm D: 03/31/2012 18:13:26 ET T: 03/31/2012 20:26:06 ET  JOB#: [id number]  cc: Katha Hamming, MD, <Dictator> Katha Hamming MD ELECTRONICALLY SIGNED 04/01/2012 19:40

## 2014-07-29 NOTE — H&P (Signed)
PATIENT NAME:  Jeffery Mitchell, Jeffery Mitchell MR#:  [id number] DATE OF BIRTH:  [birth year]  DATE OF ADMISSION:  04/05/2012  PRIMARY CARE PHYSICIAN: Dr. Burnadette Pop.   REFERRING PHYSICIAN: Dr. Jene Every.   CHIEF COMPLAINT: Progressive increase in shortness of breath for 3 to 4 days.   HISTORY OF PRESENT ILLNESS: The patient is an 81 year old pleasant Caucasian male with history of coronary artery disease status post stent implant, history of diastolic congestive heart failure, chronic obstructive pulmonary disease, home oxygen dependent. The patient was just discharged from the hospital on April 01, 2012, at that time admitted with shortness of breath secondary to chronic obstructive pulmonary disease exacerbation. The patient tells me that over the last 3 to 4 days he noticed progressive increase in shortness of breath, started as exertional dyspnea, then this worsened until the last 24 hours, he has dyspnea at rest. He also noted some peripheral edema in his ankles in the last several days. He reports little cough with minimal sputum production. No fever. Regarding chest pain, he says that he has intermittent chest pain at the midsternal area, lasts about 10 minutes, usually with exertion then will subside. This is ongoing for the last three months. Today, while the patient at home, his shortness of breath worsened.  EMS was called; and upon their arrival, his oxygen saturation was low,  reaching 66%. He was placed on nonrebreather with oxygen supplementation and his oxygen saturation increased to 80%, and transferred to the emergency department. Evaluation here revealed mild elevation of troponin reaching 0.43. He also has elevated B-type natriuretic peptide and pulmonary vascular congestion with pulmonary interstitial edema. Cannot rule out underlying pneumonia or infiltrates. The patient was admitted for further evaluation and treatment.   PAST MEDICAL HISTORY:  1.  Recent admission on March 31, 2012, and  discharged on April 01, 2012, with shortness of breath attributed to exacerbation of chronic obstructive pulmonary disease.  2.  Coronary artery disease, status post stent implant: The last cardiac catheter was in July of this year, 2013, that showed proximal LAD 85% stenosis. There was also first diagonal stenosis 70%, also the proximal circumflex was showing 30% stenosis. Proximal right coronary artery was 60% stenosis. Mid right coronary artery was 30%. A second lesion was 40% stenosis. Right posterior descending artery was showing 40% stenosis. The patient underwent stent implant.  3.  His history also includes systemic hypertension.  4.  Diastolic congestive heart failure.  5.  Hyperlipidemia.   PAST SURGICAL HISTORY: Coronary angiography and stent implant; hernia repair; and cataract surgery.   Social habits: Ex-chronic smoker.  He has a history of 60 years of smoking. Patient quit smoking in 2007. Additionally, he worked in hosiery.  No history of alcoholism or other drug abuse.   SOCIAL HISTORY: The patient is married and living with his wife. 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   FAMILY HISTORY: His father died from a heart attack, unclear at what age.   Admission medications:  1.  Lyrica 75 mg twice a day.  2.  Tramadol 50 mg every six hours p.r.n. 3.  Augmentin 500 mg twice a day.  4.  Aspirin 81 mg a day.  5.  Plavix 75 mg a day.  6.  Furosemide 20 mg as needed.  7.  Fish oil once a day.  8.  Isosorbide mononitrate 30 mg once a day.  9.  Lovastatin 20 mg once a day.  10.  Metoprolol succinate extended-release 25 mg once a day.  11.  Multivitamin once a day.  12.  Osteo-biFlex 1 tablet once a day.  13.  Symbicort 80/4.5 mcg 2 puffs twice a day as needed.  14.  Tamsulosin 0.4 mg once a day.   ALLERGIES: He is allergic to peanuts. This is only food allergy.  No medication allergy.   PHYSICAL EXAMINATION:  VITAL SIGNS: Blood pressure 124/59, respiratory rate 24,  pulse 101, temperature 97.3. His oxygen saturation earlier was 66% then later was 80% and now it is 100%. He is on oxygen nonrebreather.  GENERAL APPEARANCE: Elderly male laying in bed in mild to moderate respiratory distress.  HEAD AND NECK: No pallor. No icterus. No cyanosis.  EARS, NOSE, AND THROAT: Ear examination revealed normal hearing. No lesions, no discharge. Nasal mucosa was normal without ulcers, no discharge. No bleeding. Oropharyngeal area showed no ulcers, no oral thrush. No exudates.  EYES: Normal iris and conjunctivae. Pupils are about 5 mm, round, equal and reactive to light.  NECK: Supple. Trachea at midline. No thyromegaly. No cervical lymphadenopathy. No masses.  HEART: Distant heart sounds. Faint S1 and S2. No S3 or S4. No murmur. No gallop. No carotid bruits.  RESPIRATORY: Slight tachypnea without using accessory muscles. He has slight prolongation of expiratory phase. A few scattered rhonchi and rales on both lungs. Decreased breathing sounds. Increased AP diameter of the chest.  ABDOMEN: Obese, soft without tenderness. No hepatosplenomegaly. No masses. No hernias.  SKIN: No ulcers. No subcutaneous nodules. He had 2 to +3 bipedal edema.  MUSCULOSKELETAL: No joint swelling. No clubbing.  NEUROLOGIC: Cranial nerves II through XII are intact. No focal motor deficit. Sense of touch is intact.  PSYCHIATRIC: The patient is alert and oriented x3. Mood and affect were normal.   Laboratory, diagnostic, and radiological data: Chest x-ray showed cardiomegaly and increased pulmonary interstitial markings either secondary to interstitial edema or infiltrates.   EKG showed normal sinus rhythm at rate of 98 per minute. Unremarkable EKG.   Serum glucose 161. B-type natriuretic peptide was 1819.  Of note, a week ago, his BNP was only 439. BUN 20, creatinine 0.8.  Sodium was slightly low at 135, potassium 4.7. Normal liver function tests except for slightly low albumin at 2.6 with normal liver  transaminases. Troponin is slightly elevated at 0.43. CBC showed white count of 14,000, hemoglobin 10.5, hematocrit 32 with platelet count of 284. His hemoglobin a week ago was 13. Of note, the patient a week ago, had epistaxis.   ASSESSMENT:  1.  Acute respiratory failure.  2.  Pulmonary interstitial edema, likely the interpretation of the chest x-ray finding on his presentation, but cannot rule out entirely if he has pulmonary infiltrates or pneumonia.  3.  Intermittent chest pain: This is pretty much stable over the last three months, however, there is elevated troponin either secondary to non-ST elevation myocardial infarction or likely is secondary to increased demand secondary to the acute respiratory distress from the volume overload and congestive heart failure.  4.  Coronary artery disease status post stent implant.  5.  Severe chronic obstructive pulmonary disease, home oxygen dependent.  6.  Anemia. His hemoglobin a week ago was 13 and now it is 10.5 and is normocytic, normochromic. Of note, the patient had epistaxis a week ago.   PLAN: We will admit the patient to the telemetry unit. Continue oxygen supplementation.  Intravenous Lasix was ordered just now; will give 40 mg IV every six hours x4 doses, then once a day. Repeat basic metabolic profile  tomorrow. Continue followup on the troponin. Likely his presentation is secondary to pulmonary interstitial edema and volume overload; however, if he does not improve with Lasix, we need to repeat a chest x-ray and maybe start antibiotic if needed. DuoNeb p.o. 4 hours p.r.n.  Continue antiplatelet with aspirin and Plavix. I will continue low dose of metoprolol. We need to obtain his records of echocardiogram. I did not find any one recently in the last two years, but he could have one done at [facility]. Will discuss that with Dr. Burnadette PopLinthavong in the morning. I will consult his cardiologist, Dr. Gwen PoundsKowalski. From the records, it was noted that  the patient has history of diastolic congestive heart failure. For deep vein thrombosis prophylaxis, we will place him on heparin 5000 units subcutaneous twice a day. The patient indicates that he has a LIVING WILL and his CODE STATUS is full code unless he has an irreversible condition then at that time he does not want to be prolonged on life support. His CODE STATUS therefore is full code.   Time spent evaluating this patient and reviewing medical records took more than 1 hour.   ____________________________ Carney CornersAmir M. Rudene Rearwish, MD amd:th D: 04/05/2012 22:34:19 ET T: 04/05/2012 22:59:07 ET JOB#: [id number]  cc: Carney CornersAmir M. Rudene Rearwish, MD, <Dictator> Zollie ScaleAMIR M Nadelyn Enriques MD ELECTRONICALLY SIGNED 04/06/2012 6:16

## 2014-08-01 NOTE — Discharge Summary (Signed)
PATIENT NAME:  Jeffery FinchQUEEN, Jeffery R MR#:  [id number] DATE OF BIRTH:  [birth year]  DATE OF ADMISSION:  03/31/2012 DATE OF DISCHARGE:  04/01/2012  PRIMARY CARE PHYSICIAN: Marisue IvanKanhka Linthavong, MD  DISCHARGE DIAGNOSES: 1. Shortness of breath attributed to chronic obstructive pulmonary disease.  2. History of epistaxis.  3. Postherpetic neuralgia.   DISCHARGE MEDICATIONS: 1. Lyrica 75 mg twice a day. 2. Tramadol 50 to 100 mg q. 6 hours p.r.n.  3. Amoxicillin/clavulanate 500/125 mg 1 tab twice a day until completed.  4. Aspirin 81 mg daily.  5. Plavix 75 mg daily.  6. Fish oil 1200 mg daily.  7. Furosemide 20 mg daily as needed.  8. Isosorbide mononitrate 30 mg extended release once daily.  9. Lovastatin 20 mg daily.  10. Metoprolol succinate ER 25 mg once daily.  11. Multivitamin once daily.  12. Osteo Bi-Flex 1 tab daily.  13. Symbicort 80/4.5 mcg 2 puffs twice a day as needed.  14. Tamsulosin 0.4 mg daily   HISTORY OF PRESENT ILLNESS: This is an 81 year old male with history of chronic respiratory failure due to COPD on home O2, history of diastolic heart failure, history of coronary artery disease, history of hypertension and hyperlipidemia who presented to the ED with concerns about inability to use his oxygen. He had presented to the ED the day prior with epistaxis and had his nose packed. He was given Augmentin prophylaxis for this. When he returned home, he could not use his nasal for the oxygen. He developed tachycardia. He was having increased shortness of breath. In addition, he has a history of shingles, which has been treated by his PCP with Neurontin and then Lyrica. His shingles pain was quite bothersome. In the ED, he was found to be tachycardic with a pulse in the 130s. He was hypoxic with saturations of 92 to 95% on 4 liters O2. A chest x-ray showed chronic interstitial disease without any acute findings and blood work and labs were otherwise fairly unremarkable. He received Dilaudid  for the pain.   HOSPITAL COURSE: The patient was admitted on observation. He continued to receive p.r.n. Dilaudid about every 2 to 4 hours. The packing had been removed from his nares and he had no further epistaxis. His Plavix was continued and epistaxis did not recur. On his baseline O2, he had good oxygen saturation. He was given additional Toprol for the tachycardia and his pulse remained in the high 90s.   DISCHARGE INSTRUCTIONS: Outpatient medications will be continued to include Lyrica 75 mg twice a day and Tramadol 50 mg/100 mg q. 6 hours p.r.n. will be added. Recommend followup with his primary care provider in the next 1 to 2 weeks. ____________________________ A. Wendall MolaMelissa Kyilee Gregg, MD ams:sb D: 04/01/2012 11:24:22 ET T: 04/02/2012 10:17:38 ET JOB#: [id number]  cc: A. Wendall MolaMelissa Hayes Czaja, MD, <Dictator> Marisue IvanKanhka Linthavong, MD Macy MisA. MELISSA Shawnn Bouillon MD ELECTRONICALLY SIGNED 04/12/2012 14:06

## 2014-08-01 NOTE — Consult Note (Signed)
PATIENT NAME:  Jeffery Mitchell, Jeffery Mitchell MR#:  [id number] DATE OF BIRTH:  [birth year]  DATE OF CONSULTATION:  04/06/2012  REFERRING PHYSICIAN:  Rolly PancakeVivek J. Cherlynn KaiserSainani, MD CONSULTING PHYSICIAN:  Lamar BlinksBruce J. Abagayle Klutts, MD  REASON FOR CONSULTATION: Congestive heart failure with elevated troponins, hypertension, coronary artery disease and bronchitis.   CHIEF COMPLAINT: Shortness of breath.   HISTORY OF PRESENT ILLNESS: This is an 81 year old male with known coronary artery disease, status post PCI and stent placement in the left anterior descending artery in 2013, severe lung disease with chronic pulmonary hypertension, hyperlipidemia, hypertension. He has had new onset of severe shortness of breath, which is extended and worse and consistent with possibility of bronchitis. Chest x-ray does suggest some pulmonary edema as well. This may be secondary to hypoxia. He does have elevated troponin, more consistent with demand ischemia rather than acute myocardial infarction. With his coronary artery disease although, we will continue serial ECG and enzymes. EKG shows normal sinus rhythm and normal EKG. The patient does have hypertension and hyperlipidemia, appropriately medicated at this time with no evidence of significant side-effects.   REVIEW OF SYSTEMS: Remainder of review of systems negative for vision change, ringing in the ears, hearing loss, cough, congestion, heartburn, nausea, vomiting, diarrhea, bloody stools, stomach pain, extremity pain, leg weakness, cramping of the buttocks, known blood clots, headaches, blackouts, dizzy spells, nosebleeds, congestion, trouble swallowing, frequent urination, urination at night, muscle weakness, numbness, anxiety, depression, skin lesions, skin rashes.   PAST MEDICAL HISTORY:  1.  Coronary artery disease.  2.  Hypertension.  3.  Hyperlipidemia.  4.  Severe lung disease with COPD and pulmonary hypertension.   FAMILY HISTORY: No family members with early onset of cardiovascular  disease or hypertension.   SOCIAL HISTORY: Remote tobacco use. Currently denies alcohol or tobacco use.   ALLERGIES: AS LISTED.   PHYSICAL EXAMINATION:  VITAL SIGNS: Blood pressure is 126/68 bilaterally, heart rate 72 upright, reclining, and regular.  GENERAL: He is a well appearing male with some respiratory distress.  HEENT: No icterus, ulcers, hemorrhage or xanthelasma.  NECK: No thyromegaly. CARDIOVASCULAR: Regular rate and rhythm. Normal S1 and S2, distant heart sounds. No apparent significant murmur, gallop or rub. PMI is diffuse. Carotid upstroke normal without bruit. Jugular venous pressure normal.  LUNGS: Diffuse wheezes with slight decreased breath sounds in the bases.  ABDOMEN: Soft, nontender without hepatosplenomegaly or masses. Abdominal aorta is normal size without bruit.  EXTREMITIES: Show 2+ radial, trace femoral and dorsal pedal pulses with 1 to 2+ lower extremity edema. No cyanosis, clubbing or ulcers.  NEUROLOGIC: He is oriented to time, place and person with normal mood and affect.   ASSESSMENT: This is an 81 year old male with hypertension, hyperlipidemia, coronary artery disease, no evidence of previous heart dysfunction with hypoxia and possible diastolic dysfunction, congestive heart failure with minimal elevation of troponin most consistent with demand ischemia at this time, needing further medical management and treatment options.   RECOMMENDATIONS:  1.  Continue serial ECG and enzymes to assess for extent of myocardial infarction and/or demand ischemia. 2.  Lasix for pulmonary edema, lower extremity edema, which appears to be more driven by diastolic dysfunction versus hypoxia.  3.  Oxygenation treatment of bronchitis with antibiotics and inhalers as necessary.  4.  Consider echocardiogram for other possible causes and extent of pulmonary hypertension changes, valvular heart disease and/or left ventricular systolic dysfunction contributing to above.  5.   Hypertension. Control with ACE inhibitor as able.  6.  Aspirin for further risk  reduction of peripheral vascular disease and coronary artery disease, as well as Plavix if able if the patient does not have any current issues with bleeding complications. 7.  After further evaluation and treatment options of bronchitis and/or other concerns, would further consider the possibility of cardiac intervention if necessary.     ____________________________ Lamar Blinks, MD bjk:jm D: 04/06/2012 12:46:25 ET T: 04/06/2012 13:17:56 ET JOB#: [id number]  cc: Lamar Blinks, MD, <Dictator> Lamar Blinks MD ELECTRONICALLY SIGNED 04/26/2012 8:26

## 2014-08-01 NOTE — Discharge Summary (Signed)
PATIENT NAME:  Jeffery FinchQUEEN, Petr R MR#:  [id number] DATE OF BIRTH:  1928/08/12  DATE OF ADMISSION:  04/05/2012 DATE OF DISCHARGE:  04/15/2012  DISCHARGE DIAGNOSES:   1.  Acute on chronic respiratory failure secondary to end-stage chronic obstructive pulmonary disease with history of pulmonary fibrosis.  2.  History of coronary artery disease.   DISCHARGE MEDICATIONS:   1.  Lovastatin 20 mg p.o. at bedtime.  2.  Osteo Bi-Flex as directed.  3.  Symbicort 80 mcg/4.5 mcg 2 puffs b.i.d.  4.  Tamsulosin 0.4 mg daily.  5.  Imdur 30 mg p.o. daily.  6.  Fish oil 1200 mg p.o. daily.  7.  One-A-Day Essential vitamins once a day.  8.  Metoprolol succinate extended release 25 mg once a day.  9.  Flonase 2 puffs each nostril once a day.  10.  Percocet 5/325 mg 1 to 2 tabs every 4 hours as needed for pain.  11.  Lidocaine 5% patch apply b.i.d. on area of herpetic neuralgia.  12.  Pantoprazole 40 mg daily.  13.  Amitriptyline 25 mg each evening,  14.  Roxanol 20 mg/5 mL, 5 to 10 mg every 1 to 2 hours as needed managed by hospice.  15.  Steroid Dosepak as directed.  16.  Medications to hold:  Aspirin and Plavix.  17.  Home oxygen 3 liters on nasal cannula.   CONSULTS:  Pulmonology.   PROCEDURES:  None.   PERTINENT LABS:  Noncontributory.   BRIEF HOSPITAL COURSE:  Acute on chronic respiratory failure. The patient initially came in with acute on chronic respiratory failure secondary to underlying COPD exacerbation initially thought to be a CHF exacerbation, but really turned out to be a COPD exacerbation with underlying fibrosis. The patient did not seem to improve with different treatments, remained on IV Solu-Medrol for an extended period of time and was transitioned over to prednisone on day of discharge. He was seen by Dr. Meredeth IdeFleming who agreed with current regimen. There was no further treatment to be added; therefore, hospice was consulted. He is to go home with hospice care. Orders have been signed and  reviewed. Medications as stated above. He will follow up with Dr. Meredeth IdeFleming in 1 to 2 weeks to reassess and follow up with Dr. Burnadette PopLinthavong within 2 weeks if having any issues.   TIME SPENT:  Discharge did take more than 30 minutes to complete in gathering the appropriate information from case management and other physicians that were involved in his discharge.    ____________________________ Marisue IvanKanhka Kailen Hinkle, MD kl:si D: 04/17/2012 17:07:36 ET T: 04/17/2012 22:43:20 ET JOB#: [id number]  cc: Marisue IvanKanhka Ashleymarie Granderson, MD, <Dictator> Marisue IvanKANHKA Yadira Hada MD ELECTRONICALLY SIGNED 04/27/2012 10:40

## 2014-08-03 NOTE — Consult Note (Signed)
General Aspect Patient is 81 year old male who was admitted with shortness of breath.  Patient has no prior cardiac history.  He presented to Dr. Sherryll Burgerlinthavong's office last week with complaints of shortness of breath.  He was reportedly had a history of CHF in the past but no documentation of this diagnosis.  He had dyspnea with minimal exertion.  He also complained of edema in his ankles which improved overnight.  He denied any chest pain or shortness of breath.  He states that when he would mow the yard feel more short of breath.  He quit pressure following and purchased a riding lawnmower and has done well since that time.  On evaluation at Dr. Sherryll Burgerlinthavong's office, he was noted to have  no gallops or rolls.  He is a former tobacco smoker smoking one pack a day for 60 years quit in 2007.  He states it when living in [place], he was exposed to a fairly dusty environment.  He is unaware of whether his weight is increased or not.  He was given a prescription for Lasix 20 mg p.r.n. edema and scheduled for an echocardiogram which was to be read by Dr. Danella PentonMark F. Miller.  This was completed on September 23, 2011.  CT is not available.  Preliminary report with data recorded by the technician suggests preserved left ventricular function with  left ventricular hypertrophy left atrial enlargement.   Study was reported to be somewhat difficult to assess due to body habitus.  Pulmonary pressures were not recorded.  Chest x-ray done in the hospital revealed borderline cardiomegaly.  ENT was normal.  Chest CT revealed possible pulmonary fibrosis with no evidence of significant pulmonary edema.  His serum troponin was normal.   Physical Exam:   GEN obese    HEENT PERRL, hearing intact to voice    NECK supple    RESP normal resp effort  clear BS    CARD Regular rate and rhythm  Murmur    Murmur Systolic    Systolic Murmur Out flow  axilla    ABD denies tenderness  normal BS    LYMPH negative neck    EXTR positive  edema    SKIN normal to palpation    NEURO cranial nerves intact, motor/sensory function intact    PSYCH A+O to time, place, person   Review of Systems:   Subjective/Chief Complaint shortness of breath with exertion    General: No Complaints    Skin: No Complaints    ENT: No Complaints    Eyes: No Complaints    Neck: No Complaints    Respiratory: Short of breath    Cardiovascular: No Complaints    Gastrointestinal: No Complaints    Genitourinary: No Complaints    Vascular: No Complaints    Musculoskeletal: No Complaints    Neurologic: No Complaints    Hematologic: No Complaints    Endocrine: No Complaints    Psychiatric: No Complaints    Review of Systems: All other systems were reviewed and found to be negative    Medications/Allergies Reviewed Medications/Allergies reviewed     CHF:    Hernia Repair:    Cataract Extraction:   Home Medications: Medication Instructions Status  glucosamine 1 tab(s) orally once a day Active  omega-3 polyunsaturated fatty acids oral capsule 1 cap(s) orally once a day Active  One-A-Day Essentials oral tablet 1 tab(s) orally once a day Active  Lasix 20 mg oral tablet 1 tab(s) orally once a day, As Needed Active  EKG:   EKG NSR    Abnormal NSSTTW changes    No Known Allergies:     Impression 81 year old male with no prior cardiac history who recently began noting exertional shortness of breath.  Echocardiogram done at [facility] on June 14 which has not been officially read revealed preserved left ventricular function by preliminary report.  He did have mild TR and probable mild aortic valvular disease with an area valve area reported at 1.5 cm??.  Chest CT done the hospital did not reveal significant pulmonary edema.  Suggest possible pulmonary fibrosis.  Patient did smoke for 60 years quitting approximately 6 years ago.  Chest x-ray suggested cardiomegaly.  This likely is secondary to LVH which was reported on  the echo at [facility].  Repeat echo is currently pending by order.  Patient is not appear to be ischemic at the present time.  He does not clinically appear to be in severe pulmonary edema.    Plan 1.  Will obtain CD from echo done in the clinic and review. 2.  Will discontinue nitroglycerin ointment as he does not appear ischemic at the present time based on electrocardiogram and the nitroglycerin is causing a headache 3.  Continue to rule out for myocardial infarction 4.  Continue with low-dose Lasix 5 ambulate in the morning.  Further recommendations based on the patient's course   Electronic Signatures: Jeffery Mitchell (MD)  (Signed 18-Jun-13 16:59)  Authored: General Aspect/Present Illness, History and Physical Exam, Review of System, Past Medical History, Home Medications, EKG , Allergies, Impression/Plan   Last Updated: 18-Jun-13 16:59 by Jeffery Mitchell (MD)

## 2014-08-03 NOTE — Discharge Summary (Signed)
PATIENT NAME:  Jeffery Mitchell, Jeffery Mitchell MR#:  [id number] DATE OF BIRTH:  [birth year]  DATE OF ADMISSION:  09/27/2011 DATE OF DISCHARGE:  09/30/2011  FINAL DIAGNOSES:  1. Acute on chronic respiratory failure secondary to interstitial lung disease and chronic obstructive pulmonary disease.  2. Chronic left-sided diastolic congestive heart failure.  3. History of cataract repair.  4. Osteoarthritis.   HISTORY AND PHYSICAL: Please see dictated admission history and physical.   SUMMARY OF HOSPITAL COURSE: The patient is admitted after developing increasing shortness of breath over several weeks, as well as some ankle edema, initially worrisome for congestive heart failure. He had an outpatient echocardiogram performed, which revealed preserved LV function, but question of diastolic left-sided heart failure. He was placed on Lasix,  without much improvement. His BNP was noted to be normal. CT was performed, which showed no evidence of pulmonary embolism. He was noted to have chronic obstructive pulmonary disease changes with pulmonary fibrosis and enlarged right hilar lymph nodes, the largest of which was measured at 2.8 cm. Pulmonology saw the patient, and he underwent PET scanning, which revealed severe pulmonary interstitial lung disease, with the mediastinal and hilar lymph nodes PET-negative. Serologies were sent, including ANCA, which was negative, and ACE level, which was normal. Additional serologies are pending at this time.   It was felt that he had a combination of chronic obstructive pulmonary disease, with pulmonary function tests at the bedside revealing mild small airways disease, with a major component of pulmonary fibrosis. The patient revealed that he in fact had been evaluated in [place] for respiratory difficulties, the exact etiology unclear, however, Dr. Meredeth IdeFleming, our pulmonologist, did discuss the case with his doctor in [place]. It is felt therefore that some of this is progression of his  chronic disease. His oxygen saturation was 91% on room air at rest, but dropped rapidly to 85% with any movement, and it was felt that he would benefit from home oxygen.   The underlying chronicity of some of his problems were discussed at length with the patient and he understands that there will likely not be significant resolution, but hopefully we can determine whether there is any other acute process going on. He was ambulatory independently and did not feel he needed additional help at home, other than the oxygen, and so at this time he will be discharged to home in stable condition with his physical activity to be up as tolerated. He will be on 2 liters nasal cannula continuously. He was encouraged to weigh himself daily and call physician for more than 2 pounds gain in one day or 5 pounds in one week or increasing signs or symptoms of heart failure. His diet should be 2 gram sodium. He will follow-up with Dr. Meredeth IdeFleming in the next 1 to 2 weeks, and he will keep his appointment with Dr. Burnadette PopLinthavong as well.   DISCHARGE MEDICATIONS:  1. Glucosamine 1 p.o. daily.  2. Fish oil 1 p.o. daily.  3. Multivitamin 1 p.o. daily.  4. Lasix 20 mg p.o. daily p.Mitchell.n. edema.  5. Toprol-XL 25 mg p.o. daily.  6. Meloxicam 15 mg p.o. daily as needed for arthritis pain.  7. Symbicort 80/4.5, two puffs twice a day. 8. Albuterol metered-dose inhaler 2 puffs every 4 hours p.Mitchell.n. wheezing.  ____________________________ Lynnea FerrierBert J. Klein III, MD bjk:slb D: 09/30/2011 06:57:35 ET T: 09/30/2011 07:08:27 ET JOB#: [id number]  cc: Curtis SitesBert J. Klein III, MD, <Dictator> Marisue IvanKanhka Linthavong, MD Herbon E. Meredeth IdeFleming, MD Daniel NonesBERT KLEIN MD ELECTRONICALLY SIGNED 10/06/2011 8:56

## 2014-08-03 NOTE — H&P (Signed)
PATIENT NAME:  Jeffery Mitchell, Jeffery R MR#:  [id number] DATE OF BIRTH:  22-Aug-1928  DATE OF ADMISSION:  09/27/2011  PRIMARY CARE PHYSICIAN: Dr. Burnadette PopLinthavong  CHIEF COMPLAINT: Increasing shortness of breath for 2 to 3 weeks with some ankle edema.   HISTORY OF PRESENT ILLNESS: Jeffery Mitchell is an 81 year old Caucasian gentleman with history of congestive heart failure who was recently started on p.o. Lasix as outpatient for leg swelling, comes to the Emergency Room with increasing shortness of breath, PND, and ankle edema. In the Emergency Room he received IV Lasix. His vitals are stable. His chest x-ray shows cardiomegaly and minimal increased vascular markings. He his BNP although is 119. He is being admitted for further evaluation and management. His EKG shows normal sinus rhythm. Patient has been having some chest pressure which is mainly associated with increasing shortness of breath.   PAST MEDICAL HISTORY:  1. Congestive heart failure.  2. Hernia repair.  3. Cataract extraction.   ALLERGIES: No known drug allergies.   MEDICATIONS:  1. One a Day vitamin.  2. Omega-3 fatty acid 1 capsule daily.  3. Lasix 20 mg daily.   FAMILY HISTORY: Father died of MI.   SOCIAL HISTORY: He is retired. Lives with his wife. He is an ex-smoker, smoked for 60 years.   REVIEW OF SYSTEMS: CONSTITUTIONAL: No fever. Positive for fatigue and weakness. EYES: No blurred or double vision. ENT: No tinnitus, ear pain, hearing loss. RESPIRATORY: Increasing shortness of breath. No cough or hemoptysis. CARDIOVASCULAR: Positive for chest pressure and increasing shortness of breath and leg edema. GASTROINTESTINAL: No nausea, vomiting, diarrhea, abdominal pain. GENITOURINARY: No dysuria, hematuria. ENDOCRINE: No polyuria, nocturia. HEMATOLOGY: No anemia or easy bruising. SKIN: No acne, rash. Patient has spider veins in the lower extremity. EXTREMITIES: Good pedal pulses, good femoral pulses. There is trace lower extremity ankle edema.  NEUROLOGICAL: Grossly intact cranial nerves II through XII. No motor or sensory deficits. PSYCH: Patient is awake, alert, oriented x3.   LABORATORY, DIAGNOSTIC AND RADIOLOGICAL DATA: EKG shows normal sinus rhythm. Chest x-ray shows cardiomegaly with increased pulmonary vascular markings. CBC within normal limits. Basic metabolic panel within normal limits. Cardiac enzymes, first set negative. B-type natriuretic peptide 119. Chest x-ray: Noted changes with recurrent pulmonary vascular congestion, cardiomegaly. No pneumonia noted.   ASSESSMENT: 81 year old Jeffery Mitchell with:  1. Congestive heart failure, acute onset, suspect systolic pending echo. Chest x-ray shows increased vascular markings with cardiomegaly. Patient presented with increasing shortness of breath, leg edema and PND. [facility] hence will not order. Will admit to telemetry floor for Dr. Burnadette PopLinthavong. 2 grams sodium diet. Patient is a FULL CODE. IV Lasix 20 mg b.i.d. Will start patient on metoprolol 25 b.i.d., aspirin 81 mg daily. [facility] cardiology to see patient. Check lipid profile and cardiac enzymes x3.  2. Obesity. Patient recommended weight loss and exercise.  3. Chest pressure with shortness of breath, likely due to congestive heart failure and does not seem to be true angina. EKG normal sinus rhythm. First set of cardiac enzymes are negative.  4. Osteoarthritis. On glucosamine.  5. Further work-up according to patient's clinical course. Hospital admission plan was discussed with patient and family members who are agreeable to it.   TIME SPENT: 50 minutes.  ____________________________ Wylie HailSona A. Allena KatzPatel, MD sap:cms D: 09/27/2011 12:37:04 ET T: 09/27/2011 12:52:01 ET JOB#: [id number]  cc: Arad Burston A. Allena KatzPatel, MD, <Dictator> Marisue IvanKanhka Linthavong, MD Willow OraSONA A [place] Dohmen MD ELECTRONICALLY SIGNED 10/01/2011 15:46
